# Patient Record
Sex: Female | Born: 1975 | Race: White | Hispanic: No | Marital: Single | Smoking: Current every day smoker
Health system: Southern US, Community
[De-identification: ages and names within clinical notes are randomized; demographics above are authoritative.]

## PROBLEM LIST (undated history)

## (undated) DIAGNOSIS — K859 Acute pancreatitis without necrosis or infection, unspecified: Secondary | ICD-10-CM

## (undated) DIAGNOSIS — F1910 Other psychoactive substance abuse, uncomplicated: Secondary | ICD-10-CM

## (undated) DIAGNOSIS — D649 Anemia, unspecified: Secondary | ICD-10-CM

---

## 1998-11-16 ENCOUNTER — Emergency Department (HOSPITAL_COMMUNITY): Admission: EM | Admit: 1998-11-16 | Discharge: 1998-11-16 | Payer: Self-pay | Admitting: Emergency Medicine

## 1999-01-08 ENCOUNTER — Emergency Department (HOSPITAL_COMMUNITY): Admission: EM | Admit: 1999-01-08 | Discharge: 1999-01-08 | Payer: Self-pay | Admitting: Emergency Medicine

## 1999-04-22 ENCOUNTER — Emergency Department (HOSPITAL_COMMUNITY): Admission: EM | Admit: 1999-04-22 | Discharge: 1999-04-22 | Payer: Self-pay

## 1999-05-18 ENCOUNTER — Encounter: Payer: Self-pay | Admitting: Emergency Medicine

## 1999-05-18 ENCOUNTER — Emergency Department (HOSPITAL_COMMUNITY): Admission: EM | Admit: 1999-05-18 | Discharge: 1999-05-18 | Payer: Self-pay | Admitting: Emergency Medicine

## 1999-09-20 ENCOUNTER — Emergency Department (HOSPITAL_COMMUNITY): Admission: EM | Admit: 1999-09-20 | Discharge: 1999-09-20 | Payer: Self-pay | Admitting: Emergency Medicine

## 1999-09-23 ENCOUNTER — Emergency Department (HOSPITAL_COMMUNITY): Admission: EM | Admit: 1999-09-23 | Discharge: 1999-09-23 | Payer: Self-pay

## 2000-01-06 ENCOUNTER — Other Ambulatory Visit: Admission: RE | Admit: 2000-01-06 | Discharge: 2000-01-06 | Payer: Self-pay | Admitting: Family Medicine

## 2000-07-31 ENCOUNTER — Encounter: Payer: Self-pay | Admitting: Emergency Medicine

## 2000-07-31 ENCOUNTER — Emergency Department (HOSPITAL_COMMUNITY): Admission: EM | Admit: 2000-07-31 | Discharge: 2000-07-31 | Payer: Self-pay | Admitting: Emergency Medicine

## 2000-09-26 ENCOUNTER — Emergency Department (HOSPITAL_COMMUNITY): Admission: EM | Admit: 2000-09-26 | Discharge: 2000-09-26 | Payer: Self-pay | Admitting: Emergency Medicine

## 2000-10-06 ENCOUNTER — Encounter: Payer: Self-pay | Admitting: Emergency Medicine

## 2000-10-06 ENCOUNTER — Emergency Department (HOSPITAL_COMMUNITY): Admission: EM | Admit: 2000-10-06 | Discharge: 2000-10-06 | Payer: Self-pay | Admitting: Emergency Medicine

## 2000-10-06 ENCOUNTER — Emergency Department (HOSPITAL_COMMUNITY): Admission: EM | Admit: 2000-10-06 | Discharge: 2000-10-06 | Payer: Self-pay | Admitting: *Deleted

## 2000-11-03 ENCOUNTER — Inpatient Hospital Stay (HOSPITAL_COMMUNITY): Admission: AD | Admit: 2000-11-03 | Discharge: 2000-11-03 | Payer: Self-pay | Admitting: Obstetrics & Gynecology

## 2001-03-09 ENCOUNTER — Other Ambulatory Visit: Admission: RE | Admit: 2001-03-09 | Discharge: 2001-03-09 | Payer: Self-pay | Admitting: Family Medicine

## 2001-08-17 ENCOUNTER — Emergency Department (HOSPITAL_COMMUNITY): Admission: EM | Admit: 2001-08-17 | Discharge: 2001-08-17 | Payer: Self-pay | Admitting: Emergency Medicine

## 2002-01-10 ENCOUNTER — Emergency Department (HOSPITAL_COMMUNITY): Admission: EM | Admit: 2002-01-10 | Discharge: 2002-01-10 | Payer: Self-pay | Admitting: Emergency Medicine

## 2002-07-29 ENCOUNTER — Emergency Department (HOSPITAL_COMMUNITY): Admission: EM | Admit: 2002-07-29 | Discharge: 2002-07-29 | Payer: Self-pay

## 2002-11-04 ENCOUNTER — Emergency Department (HOSPITAL_COMMUNITY): Admission: EM | Admit: 2002-11-04 | Discharge: 2002-11-04 | Payer: Self-pay | Admitting: Emergency Medicine

## 2002-11-04 ENCOUNTER — Encounter: Payer: Self-pay | Admitting: Emergency Medicine

## 2003-04-02 ENCOUNTER — Emergency Department (HOSPITAL_COMMUNITY): Admission: EM | Admit: 2003-04-02 | Discharge: 2003-04-03 | Payer: Self-pay

## 2003-04-03 ENCOUNTER — Encounter: Payer: Self-pay | Admitting: Emergency Medicine

## 2003-04-20 ENCOUNTER — Emergency Department (HOSPITAL_COMMUNITY): Admission: EM | Admit: 2003-04-20 | Discharge: 2003-04-20 | Payer: Self-pay | Admitting: Emergency Medicine

## 2007-04-03 ENCOUNTER — Emergency Department (HOSPITAL_COMMUNITY): Admission: EM | Admit: 2007-04-03 | Discharge: 2007-04-03 | Payer: Self-pay | Admitting: Family Medicine

## 2007-07-29 ENCOUNTER — Emergency Department (HOSPITAL_COMMUNITY): Admission: EM | Admit: 2007-07-29 | Discharge: 2007-07-29 | Payer: Self-pay | Admitting: Family Medicine

## 2008-05-13 ENCOUNTER — Emergency Department (HOSPITAL_COMMUNITY): Admission: EM | Admit: 2008-05-13 | Discharge: 2008-05-13 | Payer: Self-pay | Admitting: Family Medicine

## 2008-06-07 ENCOUNTER — Emergency Department (HOSPITAL_COMMUNITY): Admission: EM | Admit: 2008-06-07 | Discharge: 2008-06-07 | Payer: Self-pay | Admitting: Emergency Medicine

## 2008-07-02 ENCOUNTER — Emergency Department (HOSPITAL_COMMUNITY): Admission: EM | Admit: 2008-07-02 | Discharge: 2008-07-02 | Payer: Self-pay | Admitting: Family Medicine

## 2008-07-07 ENCOUNTER — Emergency Department (HOSPITAL_COMMUNITY): Admission: EM | Admit: 2008-07-07 | Discharge: 2008-07-07 | Payer: Self-pay | Admitting: Family Medicine

## 2008-07-09 ENCOUNTER — Emergency Department (HOSPITAL_COMMUNITY): Admission: EM | Admit: 2008-07-09 | Discharge: 2008-07-09 | Payer: Self-pay | Admitting: Emergency Medicine

## 2008-08-07 ENCOUNTER — Emergency Department (HOSPITAL_COMMUNITY): Admission: EM | Admit: 2008-08-07 | Discharge: 2008-08-08 | Payer: Self-pay | Admitting: Emergency Medicine

## 2008-09-02 ENCOUNTER — Emergency Department (HOSPITAL_COMMUNITY): Admission: EM | Admit: 2008-09-02 | Discharge: 2008-09-02 | Payer: Self-pay | Admitting: Emergency Medicine

## 2008-12-07 ENCOUNTER — Emergency Department (HOSPITAL_COMMUNITY): Admission: EM | Admit: 2008-12-07 | Discharge: 2008-12-07 | Payer: Self-pay | Admitting: Family Medicine

## 2009-05-28 ENCOUNTER — Ambulatory Visit (HOSPITAL_COMMUNITY): Admission: RE | Admit: 2009-05-28 | Discharge: 2009-05-28 | Payer: Self-pay | Admitting: Obstetrics and Gynecology

## 2009-07-02 ENCOUNTER — Emergency Department (HOSPITAL_COMMUNITY): Admission: EM | Admit: 2009-07-02 | Discharge: 2009-07-02 | Payer: Self-pay | Admitting: Emergency Medicine

## 2009-07-15 ENCOUNTER — Inpatient Hospital Stay (HOSPITAL_COMMUNITY): Admission: AD | Admit: 2009-07-15 | Discharge: 2009-07-16 | Payer: Self-pay | Admitting: Obstetrics and Gynecology

## 2009-07-18 ENCOUNTER — Emergency Department (HOSPITAL_COMMUNITY): Admission: EM | Admit: 2009-07-18 | Discharge: 2009-07-18 | Payer: Self-pay | Admitting: Family Medicine

## 2009-07-18 ENCOUNTER — Emergency Department (HOSPITAL_COMMUNITY): Admission: EM | Admit: 2009-07-18 | Discharge: 2009-07-18 | Payer: Self-pay | Admitting: Emergency Medicine

## 2009-11-23 ENCOUNTER — Emergency Department (HOSPITAL_COMMUNITY): Admission: EM | Admit: 2009-11-23 | Discharge: 2009-11-24 | Payer: Self-pay | Admitting: Emergency Medicine

## 2010-07-01 IMAGING — CT CT ABDOMEN W/ CM
2 of 5 series · 17 of 46 positions shown, 19 images · IV contrast (APPLIED)
Comparison: None

CT ABDOMEN

CLINICAL DATA: Right sided abdominal pain.  Elevated white count,
fever, chills.

CT ABDOMEN AND PELVIS WITH CONTRAST
TECHNIQUE: Multidetector CT imaging of the abdomen and pelvis was
performed using the standard protocol following bolus
administration of intravenous contrast.
Contrast: 80 ml Rmnipaque-YTT

[Series 2: abd/pelv with 5.0 b31f st · axial · 0.60mm/px · z∈[+878,+1258]mm · 14 of 86 slices shown, 16 images]
[im 5/86  soft-tissue]
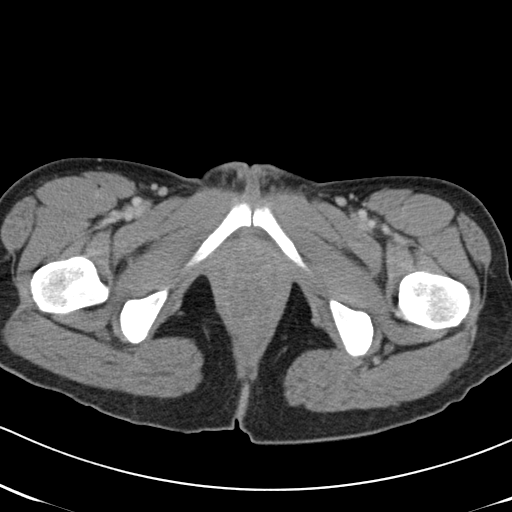
[im 5/86  bone]
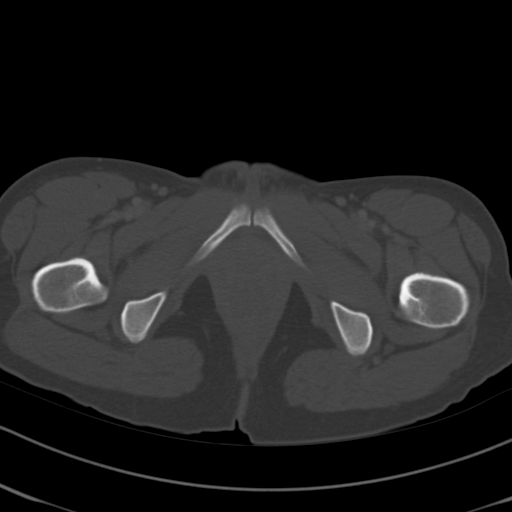
[im 9/86  soft-tissue]
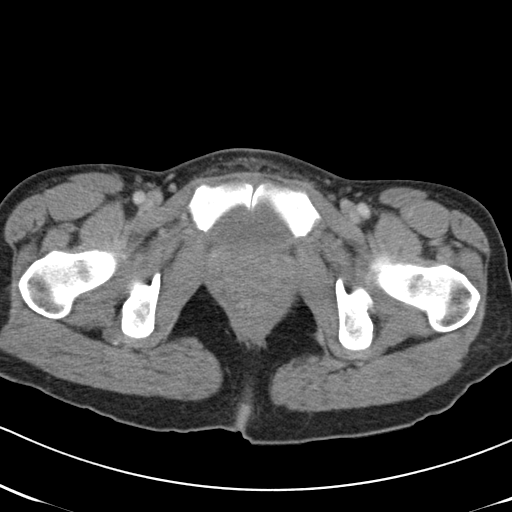
[im 18/86  soft-tissue]
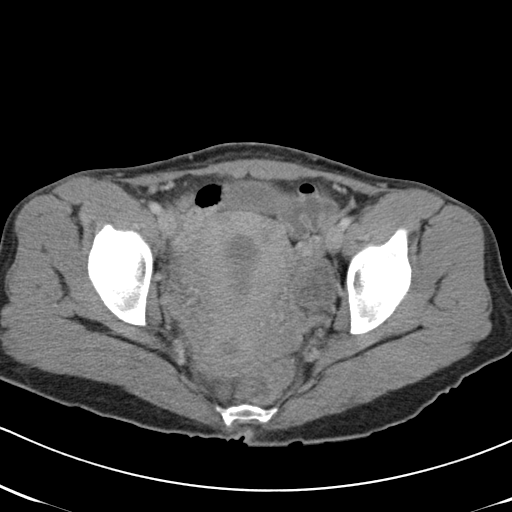
[im 23/86  soft-tissue]
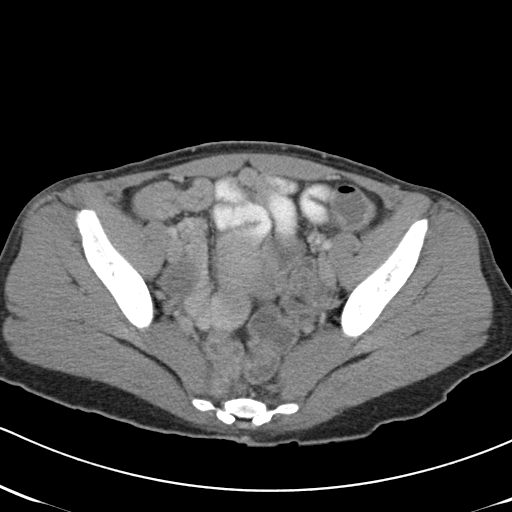
[im 27/86  soft-tissue]
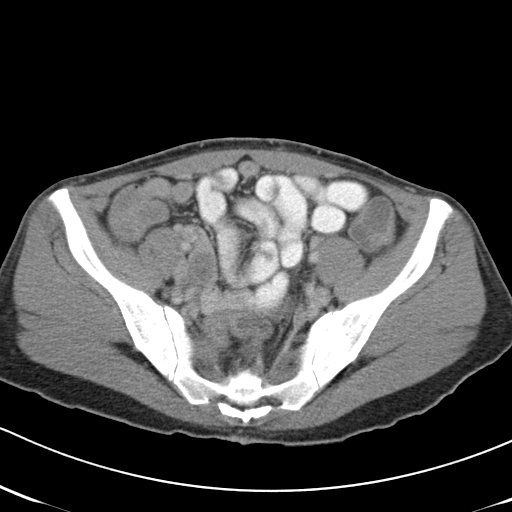
[im 36/86  soft-tissue]
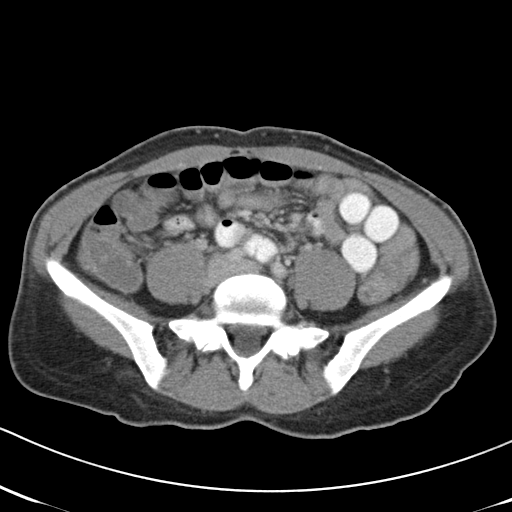
[im 41/86  soft-tissue]
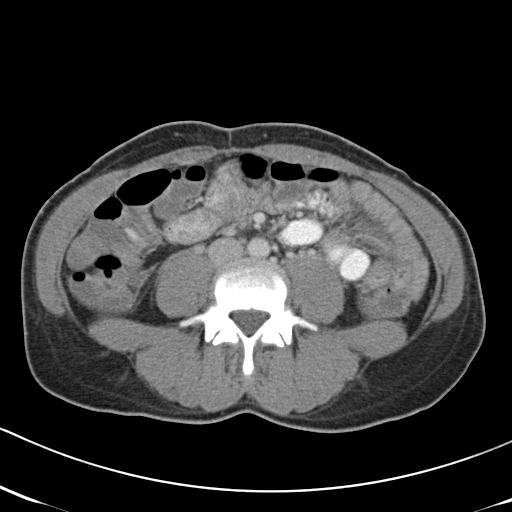
[im 45/86  soft-tissue]
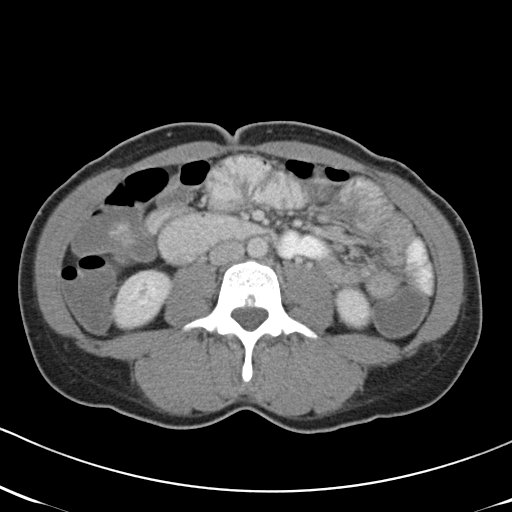
[im 50/86  soft-tissue]
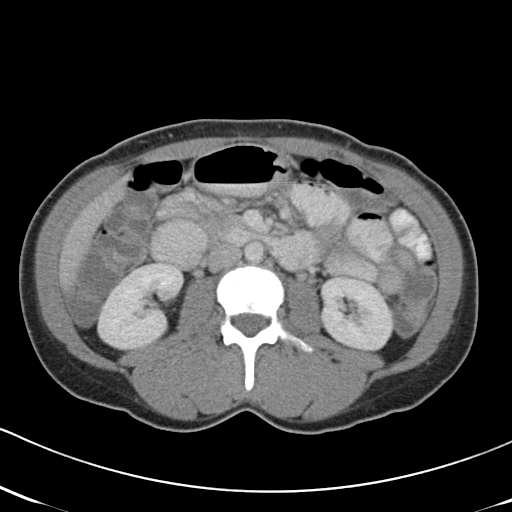
[im 50/86  bone]
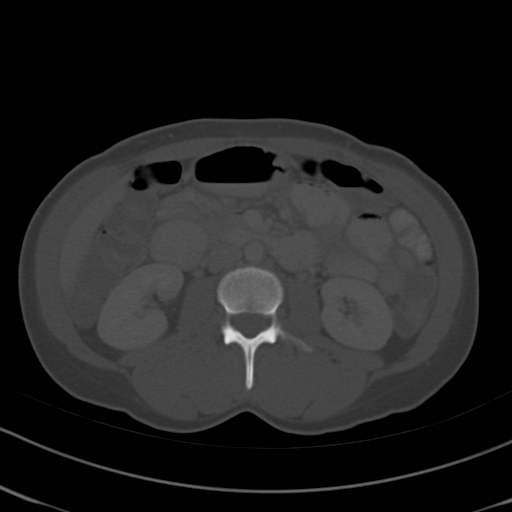
[im 59/86  soft-tissue]
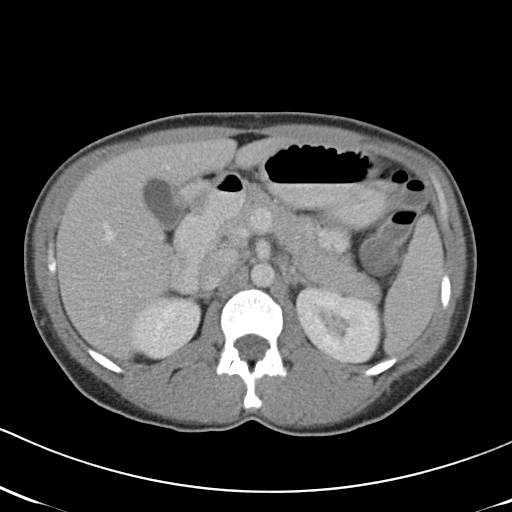
[im 63/86  soft-tissue]
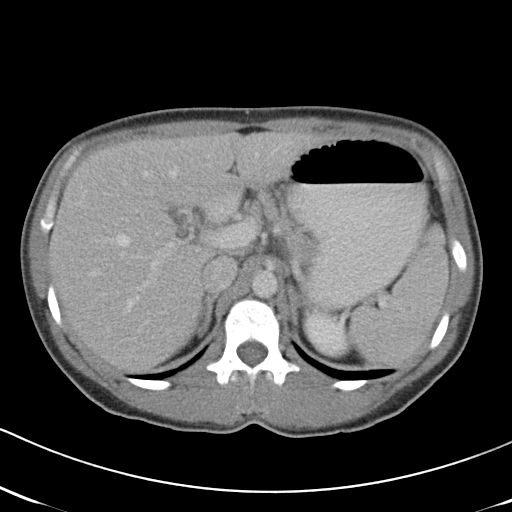
[im 68/86  soft-tissue]
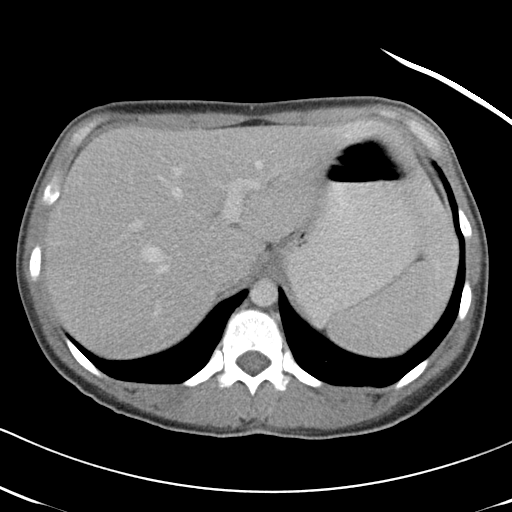
[im 77/86  soft-tissue]
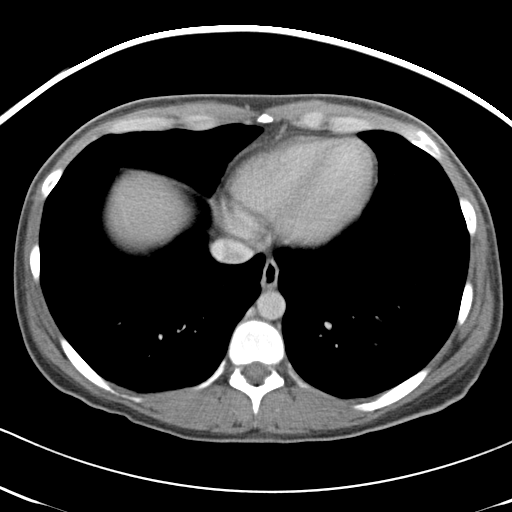
[im 81/86  soft-tissue]
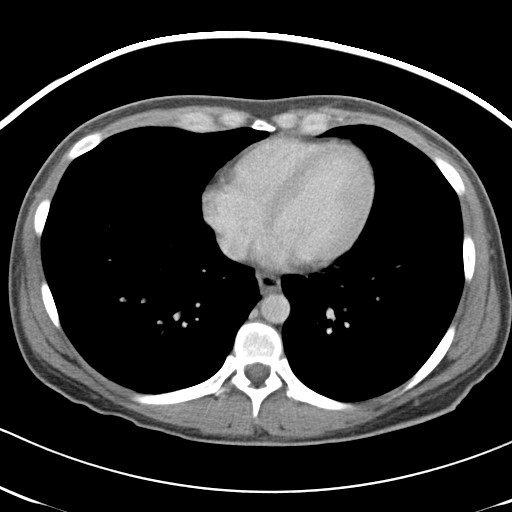

[Series 5: abd/pelv with 2.0 spo st · coronal · 0.83mm/px · 3 of 97 slices shown]
[im 33/97  soft-tissue]
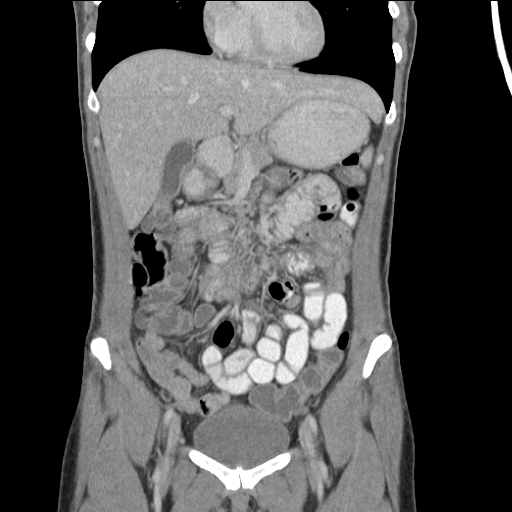
[im 43/97  soft-tissue]
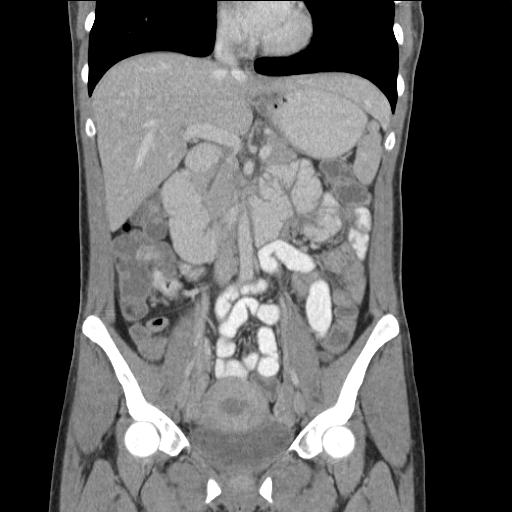
[im 54/97  soft-tissue]
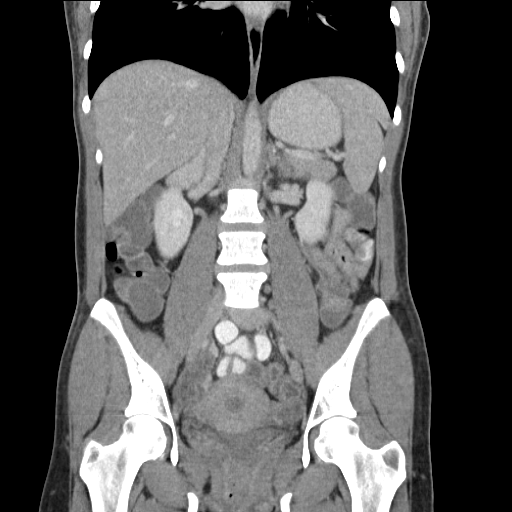

[17 of 46 positions shown; findings below may reference images not displayed]

FINDINGS: Lung bases are clear.  No effusions.  Heart is normal
size.

Liver, spleen, pancreas, adrenals, kidneys unremarkable. No
hydronephrosis. No biliary ductal dilatation. Bowel grossly
unremarkable.  No free fluid, free air, or adenopathy. Aorta is
normal caliber.  Gallbladder unremarkable.  No renal stones.
IMPRESSION: No acute findings in the abdomen.

CT PELVIS
FINDINGS: Although difficult to confirm, I believe I see a normal
appendix in the right lower quadrant.  Fluid noted within the
endometrium of the uterus.  Ovaries grossly unremarkable.  No free
fluid, free air, or adenopathy.  Pelvic small bowel unremarkable.

No acute bony abnormality.
IMPRESSION: Although difficult to visualize, I believe I see a normal appendix.

No definite acute process in the pelvis.

## 2010-09-01 ENCOUNTER — Emergency Department (HOSPITAL_COMMUNITY)
Admission: EM | Admit: 2010-09-01 | Discharge: 2010-09-01 | Discharge status: Against Medical Advice | Payer: Self-pay | Attending: Emergency Medicine | Admitting: Emergency Medicine

## 2010-09-01 DIAGNOSIS — R4182 Altered mental status, unspecified: Secondary | ICD-10-CM | POA: Insufficient documentation

## 2010-09-01 DIAGNOSIS — F1910 Other psychoactive substance abuse, uncomplicated: Secondary | ICD-10-CM | POA: Insufficient documentation

## 2010-09-01 DIAGNOSIS — T50901A Poisoning by unspecified drugs, medicaments and biological substances, accidental (unintentional), initial encounter: Secondary | ICD-10-CM | POA: Insufficient documentation

## 2010-09-01 DIAGNOSIS — R000 Tachycardia, unspecified: Secondary | ICD-10-CM | POA: Insufficient documentation

## 2010-09-01 LAB — COMPREHENSIVE METABOLIC PANEL
AST: 38 U/L — ABNORMAL HIGH (ref 0–37)
CO2: 17 mEq/L — ABNORMAL LOW (ref 19–32)
Chloride: 106 mEq/L (ref 96–112)
Creatinine, Ser: 1.21 mg/dL — ABNORMAL HIGH (ref 0.4–1.2)
GFR calc non Af Amer: 50 mL/min — ABNORMAL LOW (ref >60)
Glucose, Bld: 121 mg/dL — ABNORMAL HIGH (ref 70–99)
Potassium: 4.5 mEq/L (ref 3.5–5.1)
Sodium: 142 mEq/L (ref 135–145)
Total Protein: 7.5 g/dL (ref 6.0–8.3)

## 2010-09-01 LAB — COMPREHENSIVE METABOLIC PANEL WITH GFR
ALT: 23 U/L (ref 0–35)
Albumin: 4 g/dL (ref 3.5–5.2)
Alkaline Phosphatase: 90 U/L (ref 39–117)
BUN: 8 mg/dL (ref 6–23)
Calcium: 9.3 mg/dL (ref 8.4–10.5)
GFR calc Af Amer: >60 mL/min (ref >60)
Total Bilirubin: 0.6 mg/dL (ref 0.3–1.2)

## 2010-09-01 LAB — DIFFERENTIAL
Basophils Absolute: 0 10*3/uL (ref 0.0–0.1)
Basophils Relative: 0 % (ref 0–1)
Eosinophils Absolute: 0.2 10*3/uL (ref 0.0–0.7)
Eosinophils Relative: 1 % (ref 0–5)
Lymphocytes Relative: 14 % (ref 12–46)
Lymphs Abs: 2.4 K/uL (ref 0.7–4.0)
Monocytes Absolute: 1 K/uL (ref 0.1–1.0)
Monocytes Relative: 6 % (ref 3–12)
Neutro Abs: 13.2 10*3/uL — ABNORMAL HIGH (ref 1.7–7.7)
Neutrophils Relative %: 79 % — ABNORMAL HIGH (ref 43–77)

## 2010-09-01 LAB — CBC
HCT: 37.9 % (ref 36.0–46.0)
Hemoglobin: 12.8 g/dL (ref 12.0–15.0)
MCH: 32.9 pg (ref 26.0–34.0)
MCHC: 33.8 g/dL (ref 30.0–36.0)
MCV: 97.4 fL (ref 78.0–100.0)
Platelets: 330 K/uL (ref 150–400)
RBC: 3.89 MIL/uL (ref 3.87–5.11)
RDW: 13.2 % (ref 11.5–15.5)
WBC: 16.8 K/uL — ABNORMAL HIGH (ref 4.0–10.5)

## 2010-09-01 LAB — ACETAMINOPHEN LEVEL: Acetaminophen (Tylenol), Serum: <10 ug/mL — ABNORMAL LOW (ref 10–30)

## 2010-09-01 LAB — ETHANOL: Alcohol, Ethyl (B): <5 mg/dL (ref 0–10)

## 2010-09-01 LAB — URINALYSIS, ROUTINE W REFLEX MICROSCOPIC
Bilirubin Urine: NEGATIVE
Hgb urine dipstick: NEGATIVE
Ketones, ur: NEGATIVE mg/dL
Nitrite: NEGATIVE
Protein, ur: NEGATIVE mg/dL
Specific Gravity, Urine: 1.008 (ref 1.005–1.030)
Urine Glucose, Fasting: NEGATIVE mg/dL
Urobilinogen, UA: 0.2 mg/dL (ref 0.0–1.0)
pH: 5.5 (ref 5.0–8.0)

## 2010-09-01 LAB — RAPID URINE DRUG SCREEN, HOSP PERFORMED
Amphetamines: NOT DETECTED
Barbiturates: NOT DETECTED
Benzodiazepines: POSITIVE — AB
Cocaine: NOT DETECTED
Opiates: POSITIVE — AB
Tetrahydrocannabinol: NOT DETECTED

## 2010-09-01 LAB — PREGNANCY, URINE: Preg Test, Ur: NEGATIVE

## 2010-09-01 LAB — SALICYLATE LEVEL: Salicylate Lvl: <4 mg/dL (ref 2.8–20.0)

## 2010-09-01 LAB — TRICYCLICS SCREEN, URINE: TCA Scrn: NOT DETECTED

## 2010-09-11 NOTE — H&P (Signed)
Kimberly Parsons, Kimberly Parsons NO.:  [id number]  MEDICAL RECORD NO.:  [id number]           PATIENT TYPE:  E  LOCATION:  WLED                         FACILITY:  [facility]  PHYSICIAN:  Arne Cleveland, MD       DATE OF BIRTH:  11/04/1975  DATE OF ADMISSION:  09/01/2010 DATE OF DISCHARGE:                             HISTORY & PHYSICAL   CHIEF COMPLAINT:  Altered mental status.  HISTORY OF PRESENT ILLNESS:  A 35 year old, Caucasian female who took an undetermined number of Wellbutrin last night.  The patient states that she was not trying to hurt herself or commit suicide and that she was just in a lot of pain from her right shoulder and wanted to try to sleep.  This morning, she you was walking to the store and became very disoriented, combative and her significant other called EMS, and she was taken to [facility].  The patient, according to EMS, has a known history of polysubstance abuse, cannabis, crack cocaine and known history of an accidental overdose.  The patient initially had to be restrained in the ER, but after receiving some Ativan and being monitored in the emergency room, her altered mental status improved.  At the time I saw her, she was alert and oriented to person, place and time.  She stated that she was not trying to harm herself and that she just accidentally took a few more Wellbutrin than she should of because she wanted to get some sleep because of the pain in her shoulder, but she was not trying to harm herself, and she has no intentions of suicide so or intentions of harming others.  PAST MEDICAL HISTORY:  The patient's past medical history is significant for polysubstance abuse.  SOCIAL HISTORY:  She smokes a pack a day, and she has multiple substance abuse.  Denies alcohol abuse.  PAST SURGICAL HISTORY:  None.  MEDICATIONS:  She only takes over-the-counter medicines, Motrin and vitamins.  The Wellbutrin that she took was a prescription  that she had from long time ago.  LABORATORY DATA:  Urine dip is negative for any evidence of infection. CBC shows a white count of 16,000, hemoglobin 12.8, hematocrit 37.9. Drug screen is positive for opiates, cocaine not detected, benzodiazepines positive, amphetamines negative, cannabis not detected, barbiturates not detected.  Acetaminophen level is undetected. Comprehensive metabolic panel is normal except for CO2 of 17, glucose elevated at 121, creatinine elevated 1.21.  Liver enzyme elevated at 38 IU/L.  Normal is 37.  PHYSICAL EXAMINATION:  VITAL SIGNS:  Her vitals were 145/83, pulse 110, respirations 20, temperature 100. Well-developed, well-nourished in no acute distress, very anxious. HEAD:  Atraumatic, normocephalic. EYES: Pupils equal, round, reactive to light.  There is sharp extraocular muscle. Range of motion is full. NECK:  Supple without jugular venous distention, thyromegaly or thyroid mass. CHEST:  Nontender. HEART:  Regular rhythm and rate approximately 110 without murmur. ABDOMEN: Soft, nontender with normoactive bowel sounds. EXTREMITIES:  No clubbing, cyanosis or edema. NEUROLOGIC EXAM:  Oriented x3, awake and alert.  Motor 5/5 in upper and lower extremities.  Sensory intact to fine  touch and pinprick. SKIN:  No unusual rash or lesions. LYMPH NODES:  Cervical, axillary and inguinal lymph nodes normal.  MENTAL STATUS EXAM:  Other than being very anxious, the patient is appropriate and just very anxious.  IMPRESSION:  Accidental overdose with Wellbutrin, polysubstance abuse and right shoulder pain.  The patient initially wanted to sign out against medical advice, but she decided to stay.  We contacted Poison Control, and they recommended observation for seizure precautions and Ativan for anxiety and agitation.  PLAN:  My plan is to admit the patient to observation telemetry bed with family or sitter present.  Get a psychiatric evaluation,  seizure precautions and routine labs in the morning, a complete blood count and comprehensive metabolic panel.  Further evaluation and treatment as indicated by hospital course.  The patient does not have a primary care physician.  The patient will be admitted to [facility].     Arne Cleveland, MD     ML/MEDQ  D:  09/01/2010  T:  09/01/2010  Job:  [id number]  Electronically Signed by Arne Cleveland  on 09/11/2010 02:48:05 PM

## 2010-10-13 LAB — COMPREHENSIVE METABOLIC PANEL
ALT: 14 U/L (ref 0–35)
AST: 36 U/L (ref 0–37)
Albumin: 4.1 g/dL (ref 3.5–5.2)
Calcium: 9.3 mg/dL (ref 8.4–10.5)
Chloride: 104 mEq/L (ref 96–112)
GFR calc non Af Amer: 51 mL/min — ABNORMAL LOW (ref >60)
Glucose, Bld: 273 mg/dL — ABNORMAL HIGH (ref 70–99)
Potassium: 3.1 mEq/L — ABNORMAL LOW (ref 3.5–5.1)
Sodium: 137 mEq/L (ref 135–145)
Total Protein: 7.6 g/dL (ref 6.0–8.3)

## 2010-10-13 LAB — RAPID URINE DRUG SCREEN, HOSP PERFORMED
Amphetamines: NOT DETECTED
Opiates: POSITIVE — AB
Tetrahydrocannabinol: NOT DETECTED

## 2010-10-13 LAB — CBC
Hemoglobin: 12.4 g/dL (ref 12.0–15.0)
Platelets: 326 10*3/uL (ref 150–400)
WBC: 11.8 10*3/uL — ABNORMAL HIGH (ref 4.0–10.5)

## 2010-10-13 LAB — URINALYSIS, ROUTINE W REFLEX MICROSCOPIC
Nitrite: POSITIVE — AB
Specific Gravity, Urine: 1.014 (ref 1.005–1.030)
Urobilinogen, UA: 0.2 mg/dL (ref 0.0–1.0)

## 2010-10-13 LAB — DIFFERENTIAL
Lymphocytes Relative: 9 % — ABNORMAL LOW (ref 12–46)
Lymphs Abs: 1.1 10*3/uL (ref 0.7–4.0)
Monocytes Relative: 4 % (ref 3–12)
Neutrophils Relative %: 85 % — ABNORMAL HIGH (ref 43–77)

## 2010-10-13 LAB — ACETAMINOPHEN LEVEL: Acetaminophen (Tylenol), Serum: <10 ug/mL — ABNORMAL LOW (ref 10–30)

## 2010-10-13 LAB — SALICYLATE LEVEL: Salicylate Lvl: 6.5 mg/dL (ref 2.8–20.0)

## 2010-10-13 LAB — URINE MICROSCOPIC-ADD ON

## 2010-10-13 LAB — GLUCOSE, CAPILLARY: Glucose-Capillary: 274 mg/dL — ABNORMAL HIGH (ref 70–99)

## 2010-10-13 LAB — POCT PREGNANCY, URINE: Preg Test, Ur: NEGATIVE

## 2010-10-26 LAB — URINALYSIS, ROUTINE W REFLEX MICROSCOPIC
Bilirubin Urine: NEGATIVE
Specific Gravity, Urine: 1.012 (ref 1.005–1.030)
Urobilinogen, UA: 0.2 mg/dL (ref 0.0–1.0)

## 2010-10-26 LAB — URINE MICROSCOPIC-ADD ON

## 2010-10-26 LAB — CBC
HCT: 20 % — ABNORMAL LOW (ref 36.0–46.0)
Hemoglobin: 6.9 g/dL — CL (ref 12.0–15.0)
MCV: 101.4 fL — ABNORMAL HIGH (ref 78.0–100.0)
RBC: 3.13 MIL/uL — ABNORMAL LOW (ref 3.87–5.11)
WBC: 11.5 10*3/uL — ABNORMAL HIGH (ref 4.0–10.5)
WBC: 15.7 10*3/uL — ABNORMAL HIGH (ref 4.0–10.5)

## 2010-10-26 LAB — RPR: RPR Ser Ql: NONREACTIVE

## 2010-10-27 LAB — URINALYSIS, ROUTINE W REFLEX MICROSCOPIC
Glucose, UA: NEGATIVE mg/dL
Nitrite: NEGATIVE
Protein, ur: NEGATIVE mg/dL
pH: 7 (ref 5.0–8.0)

## 2010-10-27 LAB — WET PREP, GENITAL: Yeast Wet Prep HPF POC: NONE SEEN

## 2010-10-27 LAB — DIFFERENTIAL
Basophils Relative: 0 % (ref 0–1)
Lymphs Abs: 1.9 10*3/uL (ref 0.7–4.0)
Monocytes Relative: 4 % (ref 3–12)
Neutro Abs: 10.2 10*3/uL — ABNORMAL HIGH (ref 1.7–7.7)
Neutrophils Relative %: 80 % — ABNORMAL HIGH (ref 43–77)

## 2010-10-27 LAB — COMPREHENSIVE METABOLIC PANEL
BUN: 6 mg/dL (ref 6–23)
Calcium: 8.3 mg/dL — ABNORMAL LOW (ref 8.4–10.5)
Glucose, Bld: 90 mg/dL (ref 70–99)
Total Protein: 6 g/dL (ref 6.0–8.3)

## 2010-10-27 LAB — CBC
HCT: 28.2 % — ABNORMAL LOW (ref 36.0–46.0)
Hemoglobin: 10 g/dL — ABNORMAL LOW (ref 12.0–15.0)
MCHC: 35.4 g/dL (ref 30.0–36.0)
RDW: 13.8 % (ref 11.5–15.5)

## 2010-11-09 LAB — URINE MICROSCOPIC-ADD ON

## 2010-11-09 LAB — URINALYSIS, ROUTINE W REFLEX MICROSCOPIC
Glucose, UA: NEGATIVE mg/dL
Ketones, ur: 15 mg/dL — AB
Specific Gravity, Urine: 1.033 — ABNORMAL HIGH (ref 1.005–1.030)
pH: 5 (ref 5.0–8.0)

## 2010-11-09 LAB — DIFFERENTIAL
Basophils Absolute: 0.1 10*3/uL (ref 0.0–0.1)
Basophils Relative: 0 % (ref 0–1)
Eosinophils Absolute: 0.2 10*3/uL (ref 0.0–0.7)
Monocytes Relative: 5 % (ref 3–12)
Neutro Abs: 21 10*3/uL — ABNORMAL HIGH (ref 1.7–7.7)
Neutrophils Relative %: 87 % — ABNORMAL HIGH (ref 43–77)

## 2010-11-09 LAB — CBC
Hemoglobin: 15.9 g/dL — ABNORMAL HIGH (ref 12.0–15.0)
RBC: 4.84 MIL/uL (ref 3.87–5.11)
WBC: 24.2 10*3/uL — ABNORMAL HIGH (ref 4.0–10.5)

## 2010-11-09 LAB — COMPREHENSIVE METABOLIC PANEL
Alkaline Phosphatase: 82 U/L (ref 39–117)
BUN: 13 mg/dL (ref 6–23)
CO2: 22 mEq/L (ref 19–32)
GFR calc non Af Amer: >60 mL/min (ref >60)
Glucose, Bld: 138 mg/dL — ABNORMAL HIGH (ref 70–99)
Potassium: 4.2 mEq/L (ref 3.5–5.1)
Total Protein: 8 g/dL (ref 6.0–8.3)

## 2010-11-09 LAB — RAPID URINE DRUG SCREEN, HOSP PERFORMED
Amphetamines: NOT DETECTED
Benzodiazepines: POSITIVE — AB
Tetrahydrocannabinol: NOT DETECTED

## 2010-11-09 LAB — LIPASE, BLOOD: Lipase: 553 U/L — ABNORMAL HIGH (ref 11–59)

## 2010-11-09 LAB — ETHANOL: Alcohol, Ethyl (B): <5 mg/dL (ref 0–10)

## 2010-11-10 LAB — POCT I-STAT, CHEM 8
BUN: 15 mg/dL (ref 6–23)
Calcium, Ion: 1.08 mmol/L — ABNORMAL LOW (ref 1.12–1.32)
Chloride: 108 mEq/L (ref 96–112)
HCT: 35 % — ABNORMAL LOW (ref 36.0–46.0)
Sodium: 140 mEq/L (ref 135–145)

## 2010-11-10 LAB — DIFFERENTIAL
Basophils Absolute: 0 10*3/uL (ref 0.0–0.1)
Lymphocytes Relative: 28 % (ref 12–46)
Lymphs Abs: 2 10*3/uL (ref 0.7–4.0)
Neutro Abs: 4.3 10*3/uL (ref 1.7–7.7)
Neutrophils Relative %: 61 % (ref 43–77)

## 2010-11-10 LAB — CBC
Platelets: 193 10*3/uL (ref 150–400)
RDW: 12.9 % (ref 11.5–15.5)
WBC: 7.2 10*3/uL (ref 4.0–10.5)

## 2010-11-10 LAB — POCT URINALYSIS DIP (DEVICE)
Bilirubin Urine: NEGATIVE
Glucose, UA: NEGATIVE mg/dL
Hgb urine dipstick: NEGATIVE
Nitrite: NEGATIVE
Specific Gravity, Urine: 1.01 (ref 1.005–1.030)
Urobilinogen, UA: 0.2 mg/dL (ref 0.0–1.0)

## 2010-11-10 LAB — LIPASE, BLOOD: Lipase: 52 U/L (ref 11–59)

## 2011-04-29 LAB — CULTURE, ROUTINE-ABSCESS

## 2011-11-14 ENCOUNTER — Emergency Department (INDEPENDENT_AMBULATORY_CARE_PROVIDER_SITE_OTHER)
Admission: EM | Admit: 2011-11-14 | Discharge: 2011-11-14 | Disposition: A | Discharge status: Home / Self Care | Payer: Self-pay | Source: Home / Self Care | Attending: Family Medicine | Admitting: Family Medicine

## 2011-11-14 ENCOUNTER — Encounter (HOSPITAL_COMMUNITY): Payer: Self-pay | Admitting: Emergency Medicine

## 2011-11-14 DIAGNOSIS — T310 Burns involving less than 10% of body surface: Secondary | ICD-10-CM

## 2011-11-14 MED ORDER — SILVER SULFADIAZINE 1 % EX CREA: TOPICAL_CREAM | Freq: Two times a day (BID) | CUTANEOUS | Status: DC

## 2011-11-14 MED ORDER — HYDROCODONE-ACETAMINOPHEN 5-325 MG PO TABS: 1.0000 | ORAL_TABLET | Freq: Four times a day (QID) | ORAL | Status: DC | PRN

## 2011-11-14 NOTE — Discharge Instructions (Signed)
Wash regularly and use cream as needed until healed, return as needed.
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
## 2011-11-14 NOTE — ED Notes (Signed)
Tetanus was administered 2 years ago
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
## 2011-11-21 ENCOUNTER — Emergency Department (INDEPENDENT_AMBULATORY_CARE_PROVIDER_SITE_OTHER)
Admission: EM | Admit: 2011-11-21 | Discharge: 2011-11-21 | Disposition: A | Discharge status: Home / Self Care | Payer: Self-pay | Source: Home / Self Care | Attending: Family Medicine | Admitting: Family Medicine

## 2011-11-21 ENCOUNTER — Encounter (HOSPITAL_COMMUNITY): Payer: Self-pay

## 2011-11-21 DIAGNOSIS — IMO0002 Reserved for concepts with insufficient information to code with codable children: Secondary | ICD-10-CM

## 2011-11-21 MED ORDER — HYDROCODONE-ACETAMINOPHEN 5-325 MG PO TABS: ORAL_TABLET | ORAL | Status: DC

## 2011-11-21 MED ORDER — CEPHALEXIN 500 MG PO CAPS: 500.0000 mg | ORAL_CAPSULE | Freq: Four times a day (QID) | ORAL | Status: AC

## 2011-11-21 MED ORDER — TETANUS-DIPHTH-ACELL PERTUSSIS 5-2.5-18.5 LF-MCG/0.5 IM SUSP
0.5000 mL | Freq: Once | INTRAMUSCULAR | Status: AC
  Administered 2011-11-21: 0.5 mL via INTRAMUSCULAR

## 2011-11-21 MED ORDER — TETANUS-DIPHTH-ACELL PERTUSSIS 5-2.5-18.5 LF-MCG/0.5 IM SUSP
INTRAMUSCULAR | Status: AC
  Filled 2011-11-21: qty 0.5

## 2011-11-21 NOTE — Discharge Instructions (Signed)
Keep wound clean with soap and water. Change dressings with each cleaning. Do not immerse in water (baths) or direct water contact (shower stream). Monitor for signs of infection such as redness, warmth, increased pain, or purulent draining; blood and pink tissue are good signs. If these develop, start antibiotics and return for re-evaluation. Return to care should your symptoms not improve, or worsen in any way.
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
## 2012-05-26 ENCOUNTER — Emergency Department (HOSPITAL_COMMUNITY)
Admission: EM | Admit: 2012-05-26 | Discharge: 2012-05-26 | Disposition: A | Discharge status: Home / Self Care | Payer: Self-pay | Attending: Emergency Medicine | Admitting: Emergency Medicine

## 2012-05-26 ENCOUNTER — Encounter (HOSPITAL_COMMUNITY): Payer: Self-pay | Admitting: *Deleted

## 2012-05-26 DIAGNOSIS — F17200 Nicotine dependence, unspecified, uncomplicated: Secondary | ICD-10-CM | POA: Insufficient documentation

## 2012-05-26 DIAGNOSIS — S5012XA Contusion of left forearm, initial encounter: Secondary | ICD-10-CM

## 2012-05-26 DIAGNOSIS — W19XXXA Unspecified fall, initial encounter: Secondary | ICD-10-CM

## 2012-05-26 DIAGNOSIS — S5010XA Contusion of unspecified forearm, initial encounter: Secondary | ICD-10-CM | POA: Insufficient documentation

## 2012-05-26 DIAGNOSIS — Y9289 Other specified places as the place of occurrence of the external cause: Secondary | ICD-10-CM | POA: Insufficient documentation

## 2012-05-26 DIAGNOSIS — R296 Repeated falls: Secondary | ICD-10-CM | POA: Insufficient documentation

## 2012-05-26 DIAGNOSIS — Y93E2 Activity, laundry: Secondary | ICD-10-CM | POA: Insufficient documentation

## 2012-05-26 NOTE — ED Provider Notes (Signed)
History    36yF with possible seizure. Friend at bedside reports that doing laundry and pt just fell to floor. Appeared to be seizing. "She just got stiff" and not responsive to him. Lasted a few minutes and then subsided. Seemed confused after. No incontinence. Pt not sure what may have happened but doesn't seem particularly concerned. Says she took 6 packs of "BC powder" and that "it must have been all of that caffeine in it." Pt cannot tell me why she took it, let alone so much. Denies HA. Denies other ingestion. Admits to hx of drug abuse but denies recently. No fever or chills. No visual complaints. No n/v. Denies ETOH.  CSN: [id number]  Arrival date & time 05/26/12  1416   First MD Initiated Contact with Patient 05/26/12 1503      Chief Complaint  Patient presents with  . Fall    (Consider location/radiation/quality/duration/timing/severity/associated sxs/prior treatment) HPI  History reviewed. No pertinent past medical history.  History reviewed. No pertinent past surgical history.  No family history on file.  History  Substance Use Topics  . Smoking status: Current Every Day Smoker  . Smokeless tobacco: Never Used  . Alcohol Use: No    OB History    Grav Para Term Preterm Abortions TAB SAB Ect Mult Living                  Review of Systems   Review of symptoms negative unless otherwise noted in HPI.   Allergies  Aspirin and Sulfa antibiotics  Home Medications   Current Outpatient Rx  Name Route Sig Dispense Refill  . BC HEADACHE POWDER PO Oral Take 6 packets by mouth once.    Marland Kitchen HYDROCODONE-ACETAMINOPHEN 5-325 MG PO TABS  Take one or two tablets every 6 hours as needed for pain 20 tablet 0    BP 114/72  Pulse 106  Temp 99.1 F (37.3 C) (Oral)  Resp 16  SpO2 100%  Physical Exam  Nursing note and vitals reviewed. Constitutional: She appears well-developed and well-nourished.  HENT:  Head: Normocephalic and atraumatic.       No intraoral trauma  noted.  Eyes: Conjunctivae normal and EOM are normal. Pupils are equal, round, and reactive to light. Right eye exhibits no discharge. Left eye exhibits no discharge.       No nystagmus  Neck: Neck supple.  Cardiovascular: Regular rhythm and normal heart sounds.  Exam reveals no gallop and no friction rub.   No murmur heard.      mildly tachycardic. No murmur  Pulmonary/Chest: Effort normal and breath sounds normal. No respiratory distress.  Abdominal: Soft. She exhibits no distension. There is no tenderness.  Musculoskeletal: She exhibits no edema.       Small contusion to ulnar aspect of mid L forearm. Minimal bony tenderness. NVI distally.  Neurological: She is alert.  Skin: Skin is warm and dry. She is not diaphoretic.  Psychiatric: Thought content normal.       Anxious. Keeps looking around the room erratically. Rubbing hands. Does not appear to be responding to internal stimuli. Speech is clear. Content is appropriate.    ED Course  Procedures (including critical care time)  Labs Reviewed - No data to display No results found.   1. Contusion of left forearm   2. Fall       MDM  36yF with possible seizure. Pt refusing w/u. Her behavior is somewhat erratic and she seems anxious. Despite this, she has medical decision making  capability. Pt's history doesn't make sense and I strongly suspect there is more to story than she is willing to discuss. I have no basis to IVC her. She understands that by refusing laboratory testing and CT that I cannot r/o possibly life threatening medical condition. She understands the benefit of this testing and is still declining. Encouraged to follow-up. Return precautions discussed.        Raeford Razor, MD 05/26/12 [phone]

## 2012-05-26 NOTE — ED Notes (Addendum)
Per EMS, pt from home.  EMS reports ?seizure, no witness.  EMS reports upon arrival pt saw EMS, started running downstairs, fell down 5 stairs.  Pt got up and started running again.  Per EMS, pt's speech is erratic and paranoid.

## 2012-05-26 NOTE — ED Notes (Signed)
Pt presenting to ed with c/o falling pt denies pain at this time.

## 2012-08-27 ENCOUNTER — Emergency Department (HOSPITAL_COMMUNITY)
Admission: EM | Admit: 2012-08-27 | Discharge: 2012-08-28 | Discharge status: Against Medical Advice | Payer: Self-pay | Attending: Emergency Medicine | Admitting: Emergency Medicine

## 2012-08-27 ENCOUNTER — Encounter (HOSPITAL_COMMUNITY): Payer: Self-pay | Admitting: Emergency Medicine

## 2012-08-27 DIAGNOSIS — E876 Hypokalemia: Secondary | ICD-10-CM | POA: Insufficient documentation

## 2012-08-27 DIAGNOSIS — R109 Unspecified abdominal pain: Secondary | ICD-10-CM | POA: Insufficient documentation

## 2012-08-27 DIAGNOSIS — K859 Acute pancreatitis without necrosis or infection, unspecified: Secondary | ICD-10-CM | POA: Insufficient documentation

## 2012-08-27 DIAGNOSIS — E871 Hypo-osmolality and hyponatremia: Secondary | ICD-10-CM | POA: Insufficient documentation

## 2012-08-27 DIAGNOSIS — F17200 Nicotine dependence, unspecified, uncomplicated: Secondary | ICD-10-CM | POA: Insufficient documentation

## 2012-08-27 HISTORY — DX: Anemia, unspecified: D64.9

## 2012-08-27 HISTORY — DX: Acute pancreatitis without necrosis or infection, unspecified: K85.90

## 2012-08-27 HISTORY — DX: Other psychoactive substance abuse, uncomplicated: F19.10

## 2012-08-27 NOTE — ED Notes (Signed)
Pt c/o generalized abd pain x 3 days, denies n/v/d

## 2012-08-28 ENCOUNTER — Ambulatory Visit (HOSPITAL_COMMUNITY): Payer: Self-pay

## 2012-08-28 ENCOUNTER — Encounter (HOSPITAL_COMMUNITY): Payer: Self-pay | Admitting: Emergency Medicine

## 2012-08-28 ENCOUNTER — Emergency Department (HOSPITAL_COMMUNITY): Payer: Self-pay

## 2012-08-28 ENCOUNTER — Emergency Department (HOSPITAL_COMMUNITY)
Admission: EM | Admit: 2012-08-28 | Discharge: 2012-08-28 | Discharge status: Against Medical Advice | Payer: Self-pay | Attending: Emergency Medicine | Admitting: Emergency Medicine

## 2012-08-28 DIAGNOSIS — R51 Headache: Secondary | ICD-10-CM | POA: Insufficient documentation

## 2012-08-28 DIAGNOSIS — F17200 Nicotine dependence, unspecified, uncomplicated: Secondary | ICD-10-CM | POA: Insufficient documentation

## 2012-08-28 LAB — CBC WITH DIFFERENTIAL/PLATELET
Basophils Relative: 1 % (ref 0–1)
Eosinophils Absolute: 0.2 10*3/uL (ref 0.0–0.7)
MCH: 32.6 pg (ref 26.0–34.0)
MCHC: 36.4 g/dL — ABNORMAL HIGH (ref 30.0–36.0)
Neutrophils Relative %: 57 % (ref 43–77)
Platelets: 359 10*3/uL (ref 150–400)
RDW: 12.1 % (ref 11.5–15.5)
WBC: 8.9 10*3/uL (ref 4.0–10.5)

## 2012-08-28 LAB — COMPREHENSIVE METABOLIC PANEL
ALT: 8 U/L (ref 0–35)
Albumin: 3.7 g/dL (ref 3.5–5.2)
Alkaline Phosphatase: 82 U/L (ref 39–117)
Potassium: 2.8 mEq/L — ABNORMAL LOW (ref 3.5–5.1)
Sodium: 128 mEq/L — ABNORMAL LOW (ref 135–145)
Total Protein: 7.3 g/dL (ref 6.0–8.3)

## 2012-08-28 LAB — URINALYSIS, ROUTINE W REFLEX MICROSCOPIC
Bilirubin Urine: NEGATIVE
Glucose, UA: NEGATIVE mg/dL
Specific Gravity, Urine: 1.009 (ref 1.005–1.030)
Urobilinogen, UA: 0.2 mg/dL (ref 0.0–1.0)

## 2012-08-28 LAB — LIPASE, BLOOD: Lipase: 41 U/L (ref 11–59)

## 2012-08-28 LAB — PREGNANCY, URINE: Preg Test, Ur: NEGATIVE

## 2012-08-28 MED ORDER — SODIUM CHLORIDE 0.9 % IV SOLN: INTRAVENOUS | Status: DC

## 2012-08-28 MED ORDER — ONDANSETRON HCL 4 MG/2ML IJ SOLN
4.0000 mg | INTRAMUSCULAR | Status: DC | PRN
  Administered 2012-08-28: 4 mg via INTRAVENOUS
  Filled 2012-08-28: qty 2

## 2012-08-28 MED ORDER — FENTANYL CITRATE 0.05 MG/ML IJ SOLN
100.0000 ug | INTRAMUSCULAR | Status: DC | PRN
  Administered 2012-08-28: 100 ug via INTRAVENOUS
  Filled 2012-08-28: qty 2

## 2012-08-28 MED ORDER — POTASSIUM CHLORIDE 20 MEQ/15ML (10%) PO LIQD: 40.0000 meq | Freq: Once | ORAL | Status: DC

## 2012-08-28 NOTE — ED Notes (Signed)
PT called to take back to a room and she stated she did not want to be seen and she was leaving.  According to nurse first, pt was on the phone constantly asking if "it they had it yet" an pacing.

## 2012-08-28 NOTE — ED Provider Notes (Signed)
History     CSN: [id number]  Arrival date & time 08/27/12  2342   First MD Initiated Contact with Patient 08/28/12 0011      Chief Complaint  Patient presents with  . Abdominal Pain     HPI Pt was seen at 0035.   Per pt, c/o gradual onset and persistence of constant right sided abd "pain" for the past 3 days.  Describes the abd pain as "cramping."  Denies N/VD, no fevers, no back pain, no rash, no CP/SOB, no black or blood in stools or emesis.       Past Medical History  Diagnosis Date  . Pancreatitis   . Polysubstance abuse   . Anemia     History reviewed. No pertinent past surgical history.   History  Substance Use Topics  . Smoking status: Current Every Day Smoker  . Smokeless tobacco: Never Used  . Alcohol Use: No     Review of Systems ROS: Statement: All systems negative except as marked or noted in the HPI; Constitutional: Negative for fever and chills. ; ; Eyes: Negative for eye pain, redness and discharge. ; ; ENMT: Negative for ear pain, hoarseness, nasal congestion, sinus pressure and sore throat. ; ; Cardiovascular: Negative for chest pain, palpitations, diaphoresis, dyspnea and peripheral edema. ; ; Respiratory: Negative for cough, wheezing and stridor. ; ; Gastrointestinal: +abd pain. Negative for nausea, vomiting, diarrhea, blood in stool, hematemesis, jaundice and rectal bleeding. . ; ; Genitourinary: Negative for dysuria, flank pain and hematuria. ; ; Musculoskeletal: Negative for back pain and neck pain. Negative for swelling and trauma.; ; Skin: Negative for pruritus, rash, abrasions, blisters, bruising and skin lesion.; ; Neuro: Negative for headache, lightheadedness and neck stiffness. Negative for weakness, altered level of consciousness , altered mental status, extremity weakness, paresthesias, involuntary movement, seizure and syncope.       Allergies  Aspirin and Sulfa antibiotics  Home Medications   Current Outpatient Rx  Name  Route  Sig   Dispense  Refill  . BC HEADACHE POWDER PO   Oral   Take 6 packets by mouth once.         Marland Kitchen DIPHENHYDRAMINE HCL 25 MG PO TABS   Oral   Take 25 mg by mouth every 6 (six) hours as needed.         . NYQUIL PO   Oral   Take 2 capsules by mouth.           BP 117/80  Pulse 107  Temp 98.4 F (36.9 C) (Oral)  Resp 20  Wt 105 lb (47.628 kg)  SpO2 100%  LMP 08/26/2012  Physical Exam 0040: Physical examination:  Nursing notes reviewed; Vital signs and O2 SAT reviewed;  Constitutional: Well developed, Well nourished, Well hydrated, In no acute distress; Head:  Normocephalic, atraumatic; Eyes: EOMI, PERRL, No scleral icterus; ENMT: Mouth and pharynx normal, Mucous membranes moist; Neck: Supple, Full range of motion, No lymphadenopathy; Cardiovascular: Regular rate and rhythm, No murmur, rub, or gallop; Respiratory: Breath sounds clear & equal bilaterally, No rales, rhonchi, wheezes.  Speaking full sentences with ease, Normal respiratory effort/excursion; Chest: Nontender, Movement normal; Abdomen: Soft, +right sided mid-abd tender to palp, no specific RUQ or RLQ tenderness to palp. No rebound or guarding. Nondistended, Normal bowel sounds; Genitourinary: No CVA tenderness; Extremities: Pulses normal, No tenderness, No edema, No calf edema or asymmetry.; Neuro: AA&Ox3, vague historian. Major CN grossly intact.  Speech clear. Climbs on and off stretcher by herself easily. Gait  steady. No gross focal motor or sensory deficits in extremities.; Skin: Color normal, Warm, Dry.   ED Course  Procedures   0330:  Pt refuses to take potassium PO and have CXR and CT A/P. Pt has already left the building once "to go out and smoke" and come back in again.  (Pt does not have IV in place.)  Multiple ED RN's and I encouraged pt to have her imaging studies and stop leaving the ED.  Pt continues to refuse and states she wants to leave now.  Pt makes her own medical decisions.  Risks of AMA explained to pt and  family, including, but not limited to:  Acute intra-abdominal pathology (ie: cholecystitis, appendicitis), seizure, stroke, heart attack, cardiac arrythmia ("irregular heart rate/beat"), "passing out," temporary and/or permanent disability, death.  Pt and family verb understanding and continue to refuse meds and imaging studies, understanding the consequences of their decision.  I encouraged pt to follow up with her PMD today and return to the ED immediately if she changes her mind, symptoms return, or for any other concerns.  Pt and family verb understanding, agreeable.    MDM  MDM Reviewed: nursing note, vitals and previous chart Reviewed previous: labs Interpretation: labs and CT scan   Results for orders placed during the hospital encounter of 08/27/12  URINALYSIS, ROUTINE W REFLEX MICROSCOPIC      Component Value Range   Color, Urine YELLOW  YELLOW   APPearance CLEAR  CLEAR   Specific Gravity, Urine 1.009  1.005 - 1.030   pH 6.0  5.0 - 8.0   Glucose, UA NEGATIVE  NEGATIVE mg/dL   Hgb urine dipstick LARGE (*) NEGATIVE   Bilirubin Urine NEGATIVE  NEGATIVE   Ketones, ur NEGATIVE  NEGATIVE mg/dL   Protein, ur NEGATIVE  NEGATIVE mg/dL   Urobilinogen, UA 0.2  0.0 - 1.0 mg/dL   Nitrite NEGATIVE  NEGATIVE   Leukocytes, UA TRACE (*) NEGATIVE  PREGNANCY, URINE      Component Value Range   Preg Test, Ur NEGATIVE  NEGATIVE  CBC WITH DIFFERENTIAL      Component Value Range   WBC 8.9  4.0 - 10.5 K/uL   RBC 4.29  3.87 - 5.11 MIL/uL   Hemoglobin 14.0  12.0 - 15.0 g/dL   HCT 08.6  57.8 - 46.9 %   MCV 89.7  78.0 - 100.0 fL   MCH 32.6  26.0 - 34.0 pg   MCHC 36.4 (*) 30.0 - 36.0 g/dL   RDW 62.9  52.8 - 41.3 %   Platelets 359  150 - 400 K/uL   Neutrophils Relative 57  43 - 77 %   Neutro Abs 5.1  1.7 - 7.7 K/uL   Lymphocytes Relative 30  12 - 46 %   Lymphs Abs 2.6  0.7 - 4.0 K/uL   Monocytes Relative 11  3 - 12 %   Monocytes Absolute 1.0  0.1 - 1.0 K/uL   Eosinophils Relative 2  0 - 5 %    Eosinophils Absolute 0.2  0.0 - 0.7 K/uL   Basophils Relative 1  0 - 1 %   Basophils Absolute 0.1  0.0 - 0.1 K/uL  COMPREHENSIVE METABOLIC PANEL      Component Value Range   Sodium 128 (*) 135 - 145 mEq/L   Potassium 2.8 (*) 3.5 - 5.1 mEq/L   Chloride 85 (*) 96 - 112 mEq/L   CO2 30  19 - 32 mEq/L   Glucose, Bld 103 (*)  70 - 99 mg/dL   BUN 6  6 - 23 mg/dL   Creatinine, Ser 0.45  0.50 - 1.10 mg/dL   Calcium 9.2  8.4 - 40.9 mg/dL   Total Protein 7.3  6.0 - 8.3 g/dL   Albumin 3.7  3.5 - 5.2 g/dL   AST 19  0 - 37 U/L   ALT 8  0 - 35 U/L   Alkaline Phosphatase 82  39 - 117 U/L   Total Bilirubin 0.2 (*) 0.3 - 1.2 mg/dL   GFR calc non Af Amer >90  >90 mL/min   GFR calc Af Amer >90  >90 mL/min  LIPASE, BLOOD      Component Value Range   Lipase 41  11 - 59 U/L  URINE MICROSCOPIC-ADD ON      Component Value Range   Squamous Epithelial / LPF RARE  RARE   WBC, UA 0-2  <3 WBC/hpf   RBC / HPF 0-2  <3 RBC/hpf   Bacteria, UA RARE  RARE               Laray Anger, DO 08/28/12 2052

## 2012-08-28 NOTE — ED Notes (Signed)
Pt stated she does not feel safe in her living situation. Pt states she feels like her life is in danger, pt denies being injured or threatened. Offered to have [employer] talk with her re concerns of safety. Pt states she does not believe she can trust the police.

## 2012-08-28 NOTE — ED Notes (Signed)
Called pt to come back to A8 and pt stated that she did not want to be seen anymore.

## 2012-08-28 NOTE — ED Notes (Signed)
Pt c/o right sided HA everyday x years since having facial fracture when she was 19; pt sts blurry vision today; pt poor historian and inapropriate affect for situation

## 2012-08-28 NOTE — ED Notes (Signed)
Patient was called to be taken back to a room to be seen when she stated she did not want to be seen now. Patient has left several times to go outside to smoke and has used the phone several time. She is in no distress at this time.

## 2012-08-29 ENCOUNTER — Emergency Department (HOSPITAL_COMMUNITY)
Admission: EM | Admit: 2012-08-29 | Discharge: 2012-08-29 | Discharge status: Against Medical Advice | Payer: Self-pay | Attending: Emergency Medicine | Admitting: Emergency Medicine

## 2012-08-29 ENCOUNTER — Encounter (HOSPITAL_COMMUNITY): Payer: Self-pay | Admitting: Emergency Medicine

## 2012-08-29 DIAGNOSIS — W19XXXA Unspecified fall, initial encounter: Secondary | ICD-10-CM | POA: Insufficient documentation

## 2012-08-29 DIAGNOSIS — Y929 Unspecified place or not applicable: Secondary | ICD-10-CM | POA: Insufficient documentation

## 2012-08-29 DIAGNOSIS — Z5320 Procedure and treatment not carried out because of patient's decision for unspecified reasons: Secondary | ICD-10-CM | POA: Insufficient documentation

## 2012-08-29 DIAGNOSIS — Y939 Activity, unspecified: Secondary | ICD-10-CM | POA: Insufficient documentation

## 2012-08-29 DIAGNOSIS — F17200 Nicotine dependence, unspecified, uncomplicated: Secondary | ICD-10-CM | POA: Insufficient documentation

## 2012-08-29 DIAGNOSIS — F1210 Cannabis abuse, uncomplicated: Secondary | ICD-10-CM | POA: Insufficient documentation

## 2012-08-29 DIAGNOSIS — IMO0002 Reserved for concepts with insufficient information to code with codable children: Secondary | ICD-10-CM | POA: Insufficient documentation

## 2012-08-29 NOTE — ED Notes (Signed)
States that she fell and has abrasions on her knees. States that she fell a couple of days ago.

## 2012-08-31 ENCOUNTER — Emergency Department (HOSPITAL_COMMUNITY)
Admission: EM | Admit: 2012-08-31 | Discharge: 2012-08-31 | Disposition: A | Discharge status: Home / Self Care | Payer: Self-pay | Source: Home / Self Care

## 2012-08-31 ENCOUNTER — Emergency Department (HOSPITAL_COMMUNITY)
Admission: EM | Admit: 2012-08-31 | Discharge: 2012-08-31 | Discharge status: Against Medical Advice | Payer: Self-pay | Attending: Emergency Medicine | Admitting: Emergency Medicine

## 2012-08-31 ENCOUNTER — Emergency Department (HOSPITAL_COMMUNITY)
Admission: EM | Admit: 2012-08-31 | Discharge: 2012-08-31 | Discharge status: Against Medical Advice | Payer: Self-pay | Source: Home / Self Care

## 2012-08-31 ENCOUNTER — Emergency Department (HOSPITAL_COMMUNITY)
Admission: EM | Admit: 2012-08-31 | Discharge: 2012-09-01 | Disposition: A | Discharge status: Home / Self Care | Payer: Self-pay | Source: Home / Self Care

## 2012-08-31 ENCOUNTER — Encounter (HOSPITAL_COMMUNITY): Payer: Self-pay | Admitting: *Deleted

## 2012-08-31 DIAGNOSIS — R1110 Vomiting, unspecified: Secondary | ICD-10-CM | POA: Insufficient documentation

## 2012-08-31 DIAGNOSIS — R1031 Right lower quadrant pain: Secondary | ICD-10-CM | POA: Insufficient documentation

## 2012-08-31 DIAGNOSIS — R109 Unspecified abdominal pain: Secondary | ICD-10-CM | POA: Insufficient documentation

## 2012-08-31 DIAGNOSIS — R6883 Chills (without fever): Secondary | ICD-10-CM | POA: Insufficient documentation

## 2012-08-31 DIAGNOSIS — R42 Dizziness and giddiness: Secondary | ICD-10-CM | POA: Insufficient documentation

## 2012-08-31 DIAGNOSIS — F17200 Nicotine dependence, unspecified, uncomplicated: Secondary | ICD-10-CM | POA: Insufficient documentation

## 2012-08-31 LAB — COMPREHENSIVE METABOLIC PANEL
ALT: 8 U/L (ref 0–35)
BUN: 4 mg/dL — ABNORMAL LOW (ref 6–23)
CO2: 27 mEq/L (ref 19–32)
Calcium: 8.8 mg/dL (ref 8.4–10.5)
Creatinine, Ser: 0.54 mg/dL (ref 0.50–1.10)
GFR calc Af Amer: >90 mL/min (ref >90)
GFR calc non Af Amer: >90 mL/min (ref >90)
Glucose, Bld: 89 mg/dL (ref 70–99)

## 2012-08-31 LAB — CBC WITH DIFFERENTIAL/PLATELET
Eosinophils Relative: 1 % (ref 0–5)
HCT: 37.2 % (ref 36.0–46.0)
Lymphocytes Relative: 22 % (ref 12–46)
Lymphs Abs: 2.3 10*3/uL (ref 0.7–4.0)
MCH: 32.9 pg (ref 26.0–34.0)
MCV: 90.1 fL (ref 78.0–100.0)
Monocytes Absolute: 0.7 10*3/uL (ref 0.1–1.0)
Monocytes Relative: 7 % (ref 3–12)
RBC: 4.13 MIL/uL (ref 3.87–5.11)
WBC: 10.5 10*3/uL (ref 4.0–10.5)

## 2012-08-31 NOTE — ED Notes (Signed)
Charge RN at [employer] called to DC pt so that [facility] ED could arrive pt. Pt noted to be drinking a milkshake, was asked multiple times to remain NPO with understanding verbalized.

## 2012-08-31 NOTE — ED Notes (Signed)
Pt states about a week ago pt started having abd pain weakness, fever, chills. Pt with nausea no vomitting. Pt denies diarrhea. Pt states she has not taken anything to relieve symptoms. Pt does not have pcp.

## 2012-08-31 NOTE — ED Notes (Signed)
PT to ED c/o RLQ pain x 1 week, chills and emesis x 1.  Describes pains as shooting.  Denies changes in bowel or bladder habits.

## 2013-07-03 ENCOUNTER — Emergency Department (HOSPITAL_COMMUNITY)
Admission: EM | Admit: 2013-07-03 | Discharge: 2013-07-03 | Disposition: A | Discharge status: Home / Self Care | Payer: Self-pay | Attending: Emergency Medicine | Admitting: Emergency Medicine

## 2013-07-03 ENCOUNTER — Encounter (HOSPITAL_COMMUNITY): Payer: Self-pay | Admitting: Emergency Medicine

## 2013-07-03 DIAGNOSIS — Z862 Personal history of diseases of the blood and blood-forming organs and certain disorders involving the immune mechanism: Secondary | ICD-10-CM | POA: Insufficient documentation

## 2013-07-03 DIAGNOSIS — M545 Low back pain, unspecified: Secondary | ICD-10-CM | POA: Insufficient documentation

## 2013-07-03 DIAGNOSIS — Z8719 Personal history of other diseases of the digestive system: Secondary | ICD-10-CM | POA: Insufficient documentation

## 2013-07-03 DIAGNOSIS — M546 Pain in thoracic spine: Secondary | ICD-10-CM | POA: Insufficient documentation

## 2013-07-03 DIAGNOSIS — M549 Dorsalgia, unspecified: Secondary | ICD-10-CM

## 2013-07-03 DIAGNOSIS — G8929 Other chronic pain: Secondary | ICD-10-CM | POA: Insufficient documentation

## 2013-07-03 DIAGNOSIS — F17200 Nicotine dependence, unspecified, uncomplicated: Secondary | ICD-10-CM | POA: Insufficient documentation

## 2013-07-03 MED ORDER — TRAMADOL HCL 50 MG PO TABS: 50.0000 mg | ORAL_TABLET | Freq: Four times a day (QID) | ORAL | Status: DC | PRN

## 2013-07-03 MED ORDER — HYDROCODONE-ACETAMINOPHEN 5-325 MG PO TABS
1.0000 | ORAL_TABLET | Freq: Once | ORAL | Status: AC
  Administered 2013-07-03: 1 via ORAL
  Filled 2013-07-03: qty 1

## 2013-07-03 NOTE — ED Provider Notes (Signed)
CSN: [id number]     Arrival date & time 07/03/13  1446 History   First MD Initiated Contact with Patient 07/03/13 1507    This chart was scribed for Sharilyn Sites PA-C, a non-physician practitioner working with Hurman Horn, MD by Lewanda Rife, ED Scribe. This patient was seen in room WTR9/WTR9 and the patient's care was started at 3:39 PM     Chief Complaint  Patient presents with  . Back Pain   (Consider location/radiation/quality/duration/timing/severity/associated sxs/prior Treatment) The history is provided by the patient and medical records. No language interpreter was used.   HPI Comments: Kimberly Parsons is a 37 y.o. female who presents to the Emergency Department with PMHx of chronic low back pain complaining of constant low back pain onset 2 weeks. 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 Describes pain as worsening and moderate in severity. No numbness or paresthesias of LE.  Denies urinary or fecal incontinence, urinary retention, perineal/saddle paresthesias, fever, PMHx of cancer, and IV drug use. Reports symptoms are exacerbated by touch and movement. Reports symptoms are alleviated when lying down in supine position. Reports trying lidocaine patches, and a heating pad with no relief of symptoms.      Past Medical History  Diagnosis Date  . Pancreatitis   . Polysubstance abuse   . Anemia    History reviewed. No pertinent past surgical history. No family history on file. History  Substance Use Topics  . Smoking status: Current Every Day Smoker -- 1.00 packs/day    Types: Cigarettes  . Smokeless tobacco: Never Used  . Alcohol Use: No   OB History   Grav Para Term Preterm Abortions TAB SAB Ect Mult Living                 Review of Systems  Constitutional: Negative for fever.  Genitourinary: Negative.   Musculoskeletal: Positive for back pain. Negative for neck pain.  Skin: Negative for rash.  Neurological:  Negative for numbness.  Psychiatric/Behavioral: Negative for confusion.  All other systems reviewed and are negative.   A complete 10 system review of systems was obtained and all systems are negative except as noted in the HPI and PMHx.    Allergies  Aspirin and Sulfa antibiotics  Home Medications   Current Outpatient Rx  Name  Route  Sig  Dispense  Refill  . HYDROcodone-acetaminophen (NORCO/VICODIN) 5-325 MG per tablet   Oral   Take 1 tablet by mouth every 6 (six) hours as needed. For pain.          BP 129/90  Pulse 99  Temp(Src) 98.4 F (36.9 C) (Oral)  Resp 20  SpO2 100%  LMP 07/03/2013  Physical Exam  Nursing note and vitals reviewed. Constitutional: She is oriented to person, place, and time. She appears well-developed and well-nourished. No distress.  HENT:  Head: Normocephalic and atraumatic.  Mouth/Throat: Oropharynx is clear and moist.  Eyes: Conjunctivae and EOM are normal. Pupils are equal, round, and reactive to light.  Neck: Normal range of motion.  Cardiovascular: Normal rate, regular rhythm and normal heart sounds.   Pulmonary/Chest: Effort normal and breath sounds normal. No respiratory distress. She has no wheezes.  Musculoskeletal: Normal range of motion.       Back:  TTP over left thoracic paraspinal region; lidoderm patch in place directly over location of pain; no bruising or deformity noted; full ROM maintainted No midline C-spine, T-spine, or L-spine tenderness; no step-offs or  deformities noted; normal gait  Neurological: She is alert and oriented to person, place, and time.  Skin: Skin is warm and dry. She is not diaphoretic.  Psychiatric: She has a normal mood and affect.    ED Course  Procedures (including critical care time)  COORDINATION OF CARE:  Nursing notes reviewed. Vital signs reviewed. Initial pt interview and examination performed.   Treatment plan initiated:Medications - No data to display  Initial diagnostic testing  ordered.    Labs Review Labs Reviewed - No data to display Imaging Review No results found.  EKG Interpretation   None       MDM   1. Back pain    Atraumatic back pain without signs of injury or deformity, x-ray not indicated.  No signs/sx concerning for cauda equina.  Offered pt Rx for tramadol-- told nursing staff that she is allergic (causes SOB) however this was not listed on her allergies list.  I went in and spoke with pt, she then claimed her allergy was nausea/vomiting.  Pt states she can only take percocet or vicodin.  Pt has hx of polysubstance abuse and i do not feel that narcotics are indicated at this time.  Pt given 1 vicodin in the ED.  Instructed she may take over the counter motrin or tylenol as needed for pain and may FU with one of the clinics listed on her resource guide.  Pt remains upset and refused to signs discharge papers.  I personally performed the services described in this documentation, which was scribed in my presence. The recorded information has been reviewed and is accurate.  Garlon Hatchet, PA-C 07/03/13 [phone]

## 2013-07-03 NOTE — ED Notes (Signed)
Pt from home c/o lower back pain x2 weeks that is getting progressively worse. 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 Pt denies urinary s/sx. Pt is A&O and in NAD

## 2013-07-05 NOTE — ED Provider Notes (Signed)
Medical screening examination/treatment/procedure(s) were performed by non-physician practitioner and as supervising physician I was immediately available for consultation/collaboration.  Jahziah Simonin M Toan Mort, MD 07/05/13 1638 

## 2013-07-22 ENCOUNTER — Emergency Department (HOSPITAL_COMMUNITY)
Admission: EM | Admit: 2013-07-22 | Discharge: 2013-07-22 | Disposition: A | Discharge status: Home / Self Care | Payer: Self-pay | Attending: Emergency Medicine | Admitting: Emergency Medicine

## 2013-07-22 ENCOUNTER — Emergency Department (HOSPITAL_COMMUNITY): Payer: Self-pay

## 2013-07-22 ENCOUNTER — Encounter (HOSPITAL_COMMUNITY): Payer: Self-pay | Admitting: Emergency Medicine

## 2013-07-22 DIAGNOSIS — R209 Unspecified disturbances of skin sensation: Secondary | ICD-10-CM | POA: Insufficient documentation

## 2013-07-22 DIAGNOSIS — Z8719 Personal history of other diseases of the digestive system: Secondary | ICD-10-CM | POA: Insufficient documentation

## 2013-07-22 DIAGNOSIS — M549 Dorsalgia, unspecified: Secondary | ICD-10-CM

## 2013-07-22 DIAGNOSIS — Y929 Unspecified place or not applicable: Secondary | ICD-10-CM | POA: Insufficient documentation

## 2013-07-22 DIAGNOSIS — IMO0002 Reserved for concepts with insufficient information to code with codable children: Secondary | ICD-10-CM | POA: Insufficient documentation

## 2013-07-22 DIAGNOSIS — W108XXA Fall (on) (from) other stairs and steps, initial encounter: Secondary | ICD-10-CM | POA: Insufficient documentation

## 2013-07-22 DIAGNOSIS — F17200 Nicotine dependence, unspecified, uncomplicated: Secondary | ICD-10-CM | POA: Insufficient documentation

## 2013-07-22 DIAGNOSIS — Y939 Activity, unspecified: Secondary | ICD-10-CM | POA: Insufficient documentation

## 2013-07-22 DIAGNOSIS — Z862 Personal history of diseases of the blood and blood-forming organs and certain disorders involving the immune mechanism: Secondary | ICD-10-CM | POA: Insufficient documentation

## 2013-07-22 MED ORDER — METHOCARBAMOL 500 MG PO TABS: 1000.0000 mg | ORAL_TABLET | Freq: Four times a day (QID) | ORAL | Status: DC

## 2013-07-22 MED ORDER — NAPROXEN 500 MG PO TABS: 500.0000 mg | ORAL_TABLET | Freq: Two times a day (BID) | ORAL | Status: DC

## 2013-07-22 MED ORDER — HYDROCODONE-ACETAMINOPHEN 5-325 MG PO TABS: ORAL_TABLET | ORAL | Status: DC

## 2013-07-22 MED ORDER — HYDROCODONE-ACETAMINOPHEN 5-325 MG PO TABS
1.0000 | ORAL_TABLET | Freq: Once | ORAL | Status: AC
  Administered 2013-07-22: 1 via ORAL
  Filled 2013-07-22: qty 1

## 2013-07-22 NOTE — ED Provider Notes (Signed)
CSN: [id number]     Arrival date & time 07/22/13  1343 History   First MD Initiated Contact with Patient 07/22/13 1550    This chart was scribed for Nelle Don, a non-physician practitioner working with Hurman Horn, MD by Lewanda Rife, ED Scribe. This patient was seen in room WTR9/WTR9 and the patient's care was started at 4:38 PM     Chief Complaint  Patient presents with  . back pain from fall    (Consider location/radiation/quality/duration/timing/severity/associated sxs/prior Treatment) The history is provided by the patient. No language interpreter was used.   HPI Comments: Kimberly Parsons is a 37 y.o. female who presents to the Emergency Department complaining of constant worsening low back pain onset yesterday after mechanical fall down 5 steps landing on low back. Describes pain as shooting down bilateral legs. Reports associated mild numbness over lateral bilateral thighs. Reports pain is exacerbated by touch and movement. Denies any alleviating factors. Denies urinary or fecal incontinence, urinary retention, perineal/saddle paresthesias, fever, PMHx of cancer, and IV drug use.  Past Medical History  Diagnosis Date  . Pancreatitis   . Polysubstance abuse   . Anemia    History reviewed. No pertinent past surgical history. No family history on file. History  Substance Use Topics  . Smoking status: Current Every Day Smoker -- 1.00 packs/day    Types: Cigarettes  . Smokeless tobacco: Never Used  . Alcohol Use: No   OB History   Grav Para Term Preterm Abortions TAB SAB Ect Mult Living                 Review of Systems  Constitutional: Negative for fever and unexpected weight change.  Gastrointestinal: Negative for constipation.       Negative for fecal incontinence.   Genitourinary: Negative for dysuria, hematuria, flank pain, vaginal bleeding, vaginal discharge and pelvic pain.       Negative for urinary incontinence or retention.  Musculoskeletal:  Positive for back pain.  Neurological: Negative for weakness and numbness.       Denies saddle paresthesias.  Psychiatric/Behavioral: Negative for confusion.    Allergies  Tramadol; Aspirin; and Sulfa antibiotics  Home Medications   Current Outpatient Rx  Name  Route  Sig  Dispense  Refill  . acetaminophen (TYLENOL) 500 MG tablet   Oral   Take 1,000 mg by mouth every 6 (six) hours as needed for mild pain or moderate pain.           BP 100/67  Pulse 107  Temp(Src) 97.8 F (36.6 C) (Oral)  Resp 20  SpO2 100%  LMP 07/03/2013 Physical Exam  Nursing note and vitals reviewed. Constitutional: She appears well-developed and well-nourished. No distress.  HENT:  Head: Normocephalic and atraumatic.  Eyes: Conjunctivae and EOM are normal.  Neck: Normal range of motion. Neck supple. No tracheal deviation present.  Cardiovascular: Normal rate.   Pulmonary/Chest: Effort normal. No respiratory distress.  Abdominal: Soft. There is no tenderness. There is no CVA tenderness.  Musculoskeletal:       Cervical back: Normal.       Thoracic back: Normal.       Lumbar back: She exhibits decreased range of motion, tenderness and bony tenderness.       Back:  No step-off noted with palpation of spine.   Neurological: She is alert. She has normal strength and normal reflexes.  Patient complains of decreased sensation bilateral lateral thighs. Otherwise, distal sensation intact.   Skin: Skin is  warm and dry. No rash noted.  Psychiatric: She has a normal mood and affect. Her behavior is normal.    ED Course  Procedures (including critical care time)  COORDINATION OF CARE:  Nursing notes reviewed. Vital signs reviewed. Initial pt interview and examination performed.   4:45 PM-Discussed work up plan with pt at bedside, which includes x-ray of Lumbar spine. Pt agrees with plan. 5:51 PM Nursing Notes Reviewed/ Care Coordinated Applicable Imaging Reviewed incorporated into ED  treatment Discussed results and treatment plan with pt. Pt demonstrates understanding and agrees with plan.   Treatment plan initiated: Medications  HYDROcodone-acetaminophen (NORCO/VICODIN) 5-325 MG per tablet 1 tablet (1 tablet Oral Given 07/22/13 1700)     Initial diagnostic testing ordered.    Labs Review Labs Reviewed - No data to display Imaging Review Dg Lumbar Spine Complete  07/22/2013   CLINICAL DATA:  PAIN POST TRAUMA  EXAM: LUMBAR SPINE - COMPLETE 4+ VIEW  COMPARISON:  Lumbar MRI July 18, 2009  FINDINGS: Frontal, lateral, spot lumbosacral lateral, and bilateral oblique views were obtained. There are 5 non-rib-bearing lumbar type vertebral bodies. There is no fracture or spondylolisthesis. Disk spaces appear intact. There is facet osteoarthritic change at L5-S1 on the left.  IMPRESSION: Mild osteoarthritic change.  No fracture or spondylolisthesis.   Electronically Signed   By: Bretta Bang M.D.   On: 07/22/2013 17:39    EKG Interpretation   None      Patient seen and examined. Work-up initiated. Medications ordered. X-ray ordered 2/2 traumatic injury.   Vital signs reviewed and are as follows: Filed Vitals:   07/22/13 1659  BP: 96/71  Pulse: 86  Temp: 97.9 F (36.6 C)  Resp: 16    X-ray neg. Pt informed.   No red flag s/s of low back pain. Patient was counseled on back pain precautions and told to do activity as tolerated but do not lift, push, or pull heavy objects more than 10 pounds for the next week.  Patient counseled to use ice or heat on back for no longer than 15 minutes every hour.   Patient prescribed muscle relaxer and counseled on proper use of muscle relaxant medication.    Patient prescribed narcotic pain medicine and counseled on proper use of narcotic pain medications. Counseled not to combine this medication with others containing tylenol.   Urged patient not to drink alcohol, drive, or perform any other activities that requires focus  while taking either of these medications.  Patient urged to follow-up with PCP if pain does not improve with treatment and rest or if pain becomes recurrent. Urged to return with worsening severe pain, loss of bowel or bladder control, trouble walking.   The patient verbalizes understanding and agrees with the plan.   MDM   1. Back pain    Patient with back pain. No significant neuro deficits. No signs of cauda equina. Patient is ambulatory. No warning symptoms of back pain including: loss of bowel or bladder control, night sweats, waking from sleep with back pain, unexplained fevers or weight loss, h/o cancer, IVDU, recent trauma. No concern for cauda equina, epidural abscess, or other serious cause of back pain. Conservative measures such as rest, ice/heat and pain medicine indicated with PCP follow-up if no improvement with conservative management.   X-ray neg for fracture.   I personally performed the services described in this documentation, which was scribed in my presence. The recorded information has been reviewed and is accurate.    Renne Crigler,  PA-C 07/22/13 1802

## 2013-07-22 NOTE — ED Notes (Signed)
MD at bedside. EDPA Josh present to speak with pt and family. Pt requesting additional pain meds

## 2013-07-22 NOTE — ED Notes (Signed)
Per pt, states she fell down 5 stairs last night and re-injured back-states she cant "feel" legs

## 2013-07-23 NOTE — ED Provider Notes (Signed)
Medical screening examination/treatment/procedure(s) were performed by non-physician practitioner and as supervising physician I was immediately available for consultation/collaboration.  Hurman Horn, MD 07/23/13 [phone]

## 2014-02-20 ENCOUNTER — Emergency Department (HOSPITAL_COMMUNITY)
Admission: EM | Admit: 2014-02-20 | Discharge: 2014-02-20 | Disposition: A | Discharge status: Home / Self Care | Payer: Self-pay | Attending: Emergency Medicine | Admitting: Emergency Medicine

## 2014-02-20 ENCOUNTER — Encounter (HOSPITAL_COMMUNITY): Payer: Self-pay | Admitting: Emergency Medicine

## 2014-02-20 DIAGNOSIS — F17200 Nicotine dependence, unspecified, uncomplicated: Secondary | ICD-10-CM | POA: Insufficient documentation

## 2014-02-20 DIAGNOSIS — Z8719 Personal history of other diseases of the digestive system: Secondary | ICD-10-CM | POA: Insufficient documentation

## 2014-02-20 DIAGNOSIS — Z791 Long term (current) use of non-steroidal anti-inflammatories (NSAID): Secondary | ICD-10-CM | POA: Insufficient documentation

## 2014-02-20 DIAGNOSIS — L02414 Cutaneous abscess of left upper limb: Secondary | ICD-10-CM

## 2014-02-20 DIAGNOSIS — Z862 Personal history of diseases of the blood and blood-forming organs and certain disorders involving the immune mechanism: Secondary | ICD-10-CM | POA: Insufficient documentation

## 2014-02-20 DIAGNOSIS — R110 Nausea: Secondary | ICD-10-CM | POA: Insufficient documentation

## 2014-02-20 DIAGNOSIS — IMO0002 Reserved for concepts with insufficient information to code with codable children: Secondary | ICD-10-CM | POA: Insufficient documentation

## 2014-02-20 MED ORDER — CLINDAMYCIN HCL 150 MG PO CAPS: 300.0000 mg | ORAL_CAPSULE | Freq: Three times a day (TID) | ORAL | Status: AC

## 2014-02-20 MED ORDER — OXYCODONE-ACETAMINOPHEN 5-325 MG PO TABS
1.0000 | ORAL_TABLET | Freq: Once | ORAL | Status: AC
  Administered 2014-02-20: 1 via ORAL
  Filled 2014-02-20: qty 1

## 2014-02-20 MED ORDER — CEPHALEXIN 500 MG PO CAPS: 500.0000 mg | ORAL_CAPSULE | Freq: Four times a day (QID) | ORAL | Status: AC

## 2014-02-20 MED ORDER — HYDROCODONE-ACETAMINOPHEN 5-325 MG PO TABS: 1.0000 | ORAL_TABLET | Freq: Four times a day (QID) | ORAL | Status: AC | PRN

## 2014-02-20 NOTE — Discharge Instructions (Signed)
Take ibuprofen for pain. Norco for severe pain only. Warm compresses several times a day. Keep wound clean and covered. Keflex as prescribed until all gone for infection. Back in 2 days for recheck. Return sooner if worsening.   Abscess An abscess is an infected area that contains a collection of pus and debris.It can occur in almost any part of the body. An abscess is also known as a furuncle or boil. CAUSES  An abscess occurs when tissue gets infected. This can occur from blockage of oil or sweat glands, infection of hair follicles, or a minor injury to the skin. As the body tries to fight the infection, pus collects in the area and creates pressure under the skin. This pressure causes pain. People with weakened immune systems have difficulty fighting infections and get certain abscesses more often.  SYMPTOMS Usually an abscess develops on the skin and becomes a painful mass that is red, warm, and tender. If the abscess forms under the skin, you may feel a moveable soft area under the skin. Some abscesses break open (rupture) on their own, but most will continue to get worse without care. The infection can spread deeper into the body and eventually into the bloodstream, causing you to feel ill.  DIAGNOSIS  Your caregiver will take your medical history and perform a physical exam. A sample of fluid may also be taken from the abscess to determine what is causing your infection. TREATMENT  Your caregiver may prescribe antibiotic medicines to fight the infection. However, taking antibiotics alone usually does not cure an abscess. Your caregiver may need to make a small cut (incision) in the abscess to drain the pus. In some cases, gauze is packed into the abscess to reduce pain and to continue draining the area. HOME CARE INSTRUCTIONS   Only take over-the-counter or prescription medicines for pain, discomfort, or fever as directed by your caregiver.  If you were prescribed antibiotics, take them as  directed. Finish them even if you start to feel better.  If gauze is used, follow your caregiver's directions for changing the gauze.  To avoid spreading the infection:  Keep your draining abscess covered with a bandage.  Wash your hands well.  Do not share personal care items, towels, or whirlpools with others.  Avoid skin contact with others.  Keep your skin and clothes clean around the abscess.  Keep all follow-up appointments as directed by your caregiver. SEEK MEDICAL CARE IF:   You have increased pain, swelling, redness, fluid drainage, or bleeding.  You have muscle aches, chills, or a general ill feeling.  You have a fever. MAKE SURE YOU:   Understand these instructions.  Will watch your condition.  Will get help right away if you are not doing well or get worse. Document Released: 04/21/2005 Document Revised: 01/11/2012 Document Reviewed: 09/24/2011 [facility] Patient Information 2015 [employer] [place]. This information is not intended to replace advice given to you by your health care provider. Make sure you discuss any questions you have with your health care provider.

## 2014-02-20 NOTE — ED Notes (Signed)
Dressing applied to left elbow by PA

## 2014-02-20 NOTE — ED Provider Notes (Signed)
CSN: [id number]     Arrival date & time 02/20/14  1227 History   First MD Initiated Contact with Patient 02/20/14 1521     Chief Complaint  Patient presents with  . Abscess     (Consider location/radiation/quality/duration/timing/severity/associated sxs/prior Treatment) HPI Kimberly Parsons is a 38 y.o. female who presents to ED with complaint of an abscess to there left forearm. Pt states she noticed a "pimple or a bite" 4 days ago. Since then it has gotten larger. Stats she "poked it with a needle, but nothing but blood came out." Pt denies any fever or chills. States she had neck pain when this started but that has not resolved. She admits to mild nausea, denies vomiting. Denies fever, chills, malaise. Denies IV drug use. States she has had similar abscess on her knee few months ago, but it resolved on its own.   Past Medical History  Diagnosis Date  . Pancreatitis   . Polysubstance abuse   . Anemia    History reviewed. No pertinent past surgical history. History reviewed. No pertinent family history. History  Substance Use Topics  . Smoking status: Current Every Day Smoker -- 1.00 packs/day    Types: Cigarettes  . Smokeless tobacco: Never Used  . Alcohol Use: No   OB History   Grav Para Term Preterm Abortions TAB SAB Ect Mult Living                 Review of Systems  Constitutional: Negative for fever and chills.  Respiratory: Negative for cough, chest tightness and shortness of breath.   Cardiovascular: Negative for chest pain, palpitations and leg swelling.  Gastrointestinal: Positive for nausea. Negative for vomiting and diarrhea.  Musculoskeletal: Negative for myalgias and neck stiffness.  Skin: Positive for wound. Negative for rash.  Neurological: Negative for weakness and headaches.  All other systems reviewed and are negative.     Allergies  Tramadol; Aspirin; and Sulfa antibiotics  Home Medications   Prior to Admission medications   Medication Sig Start  Date End Date Taking? Authorizing Provider  ibuprofen (ADVIL,MOTRIN) 200 MG tablet Take 400 mg by mouth every 6 (six) hours as needed for moderate pain.   Yes Historical Provider, MD   BP 133/97  Pulse 90  Temp(Src) 98.1 F (36.7 C) (Oral)  Resp 18  SpO2 100%  LMP 01/29/2014 Physical Exam  Nursing note and vitals reviewed. Constitutional: She appears well-developed and well-nourished. No distress.  HENT:  Head: Normocephalic.  Eyes: Conjunctivae are normal.  Neck: Neck supple.  Cardiovascular: Normal rate, regular rhythm and normal heart sounds.   Pulmonary/Chest: Effort normal and breath sounds normal. No respiratory distress. She has no wheezes. She has no rales.  Abdominal: She exhibits no distension.  Musculoskeletal: She exhibits no edema.  Normal elbow joint, with full ROM. No erythema or swelling around the joint  Neurological: She is alert.  Skin: Skin is warm and dry.  4x4cm area of erythema, induration with central fluctuance tot he proximal posterior forearm, just distal to the elbow joint.   Psychiatric: She has a normal mood and affect. Her behavior is normal.    ED Course  Procedures (including critical care time) Labs Review Labs Reviewed - No data to display  Imaging Review No results found.  INCISION AND DRAINAGE Performed by: Jaynie Crumble A Consent: Verbal consent obtained. Risks and benefits: risks, benefits and alternatives were discussed Type: abscess  Body area: left forearm  Anesthesia: local infiltration  Incision was made with  a scalpel.  Local anesthetic: lidocaine 2% w epinephrine  Anesthetic total: 3 ml  Complexity: complex Blunt dissection to break up loculations  Drainage: purulent  Drainage amount: large  Packing material: 1/4 in iodoform gauze  Patient tolerance: Patient tolerated the procedure well with no immediate complications.     MDM   Final diagnoses:  Abscess of left forearm    Patient with an  abscess to the left forearm, just distal to the elbow joint. There is no evidence of joint infection based on examination, she has no pain and has full range of motion of the elbow joint. She is afebrile, vital signs are normal, she is nontoxic appearing. There is no extensive cellulitis around the abscess. Abscess was incised and drained with large purulent drainage. Packed. 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 She states she will not be able to feel any antibiotics that are more than $4. Will start on Keflex the bring back in 2 days for recheck. I do not think patient really needs antibiotics given we did drain the abscess completely. Discussed with Dr. Effie ShyWentz who agrees. Instructed to return if symptoms are worsening.  Filed Vitals:   02/20/14 1624  BP: 113/85  Pulse:   Temp:   Resp:         Myriam Jacobsonatyana A Kaceton Vieau, PA-C 02/21/14 0128

## 2014-02-20 NOTE — ED Notes (Signed)
Reports being bit by something on left elbow approx 4 days ago, redness noted. Pt reports having nausea and blurred vision since then. No acute distress noted at triage.

## 2014-02-21 NOTE — ED Provider Notes (Signed)
Medical screening examination/treatment/procedure(s) were performed by non-physician practitioner and as supervising physician I was immediately available for consultation/collaboration.  Flint MelterElliott L Manette Doto, MD 02/21/14 [phone]

## 2015-04-26 DEATH — deceased
